# Patient Record
Sex: Male | Born: 1935 | Race: White | Hispanic: No | Marital: Married | Smoking: Never smoker
Health system: Southern US, Community
[De-identification: ages and names within clinical notes are randomized; demographics above are authoritative.]

## PROBLEM LIST (undated history)

## (undated) DIAGNOSIS — C801 Malignant (primary) neoplasm, unspecified: Secondary | ICD-10-CM

## (undated) DIAGNOSIS — F419 Anxiety disorder, unspecified: Secondary | ICD-10-CM

## (undated) DIAGNOSIS — K219 Gastro-esophageal reflux disease without esophagitis: Secondary | ICD-10-CM

## (undated) HISTORY — PX: PROSTATECTOMY: SHX69

## (undated) HISTORY — PX: OTHER SURGICAL HISTORY: SHX169

---

## 2017-09-28 ENCOUNTER — Other Ambulatory Visit: Payer: Self-pay | Admitting: Urology

## 2017-09-28 DIAGNOSIS — D49511 Neoplasm of unspecified behavior of right kidney: Secondary | ICD-10-CM

## 2017-10-06 ENCOUNTER — Ambulatory Visit (HOSPITAL_COMMUNITY): Payer: Self-pay

## 2017-10-10 ENCOUNTER — Ambulatory Visit (HOSPITAL_COMMUNITY)
Admission: RE | Admit: 2017-10-10 | Discharge: 2017-10-10 | Disposition: A | Discharge status: Home / Self Care | Payer: Medicare Other | Source: Ambulatory Visit | Attending: Urology | Admitting: Urology

## 2017-10-10 DIAGNOSIS — I700 Atherosclerosis of aorta: Secondary | ICD-10-CM | POA: Diagnosis not present

## 2017-10-10 DIAGNOSIS — D49511 Neoplasm of unspecified behavior of right kidney: Secondary | ICD-10-CM | POA: Diagnosis present

## 2017-10-10 DIAGNOSIS — K8020 Calculus of gallbladder without cholecystitis without obstruction: Secondary | ICD-10-CM | POA: Diagnosis not present

## 2017-10-10 DIAGNOSIS — K862 Cyst of pancreas: Secondary | ICD-10-CM | POA: Diagnosis not present

## 2017-10-10 LAB — POCT I-STAT CREATININE: Creatinine, Ser: 0.9 mg/dL (ref 0.61–1.24)

## 2017-10-10 MED ORDER — GADOBENATE DIMEGLUMINE 529 MG/ML IV SOLN
15.0000 mL | Freq: Once | INTRAVENOUS | Status: AC | PRN
  Administered 2017-10-10: 15 mL via INTRAVENOUS

## 2017-11-17 ENCOUNTER — Other Ambulatory Visit: Payer: Self-pay | Admitting: Urology

## 2017-12-19 NOTE — Patient Instructions (Addendum)
Cesar Dawson  12/19/2017   Your procedure is scheduled on: 01-06-18  Report to [facility] Main  Entrance  Report to admitting at        0530 AM    Call this number if you have problems the morning of surgery [phone]   Remember: Do not eat food or drink liquids :After Midnight.     Take these medicines the morning of surgery with A SIP OF WATER: omeprazole, atorvastatin                                You may not have any metal on your body including hair pins and              piercings  Do not wear jewelry,  lotions, powders or perfumes, deodorant                       Men may shave face and neck.   Do not bring valuables to the hospital. [place].  Contacts, dentures or bridgework may not be worn into surgery.  Leave suitcase in the car. After surgery it may be brought to your room.                Please read over the following fact sheets you were given: _____________________________________________________________________           [facility] - Preparing for Surgery Before surgery, you can play an important role.  Because skin is not sterile, your skin needs to be as free of germs as possible.  You can reduce the number of germs on your skin by washing with CHG (chlorahexidine gluconate) soap before surgery.  CHG is an antiseptic cleaner which kills germs and bonds with the skin to continue killing germs even after washing. Please DO NOT use if you have an allergy to CHG or antibacterial soaps.  If your skin becomes reddened/irritated stop using the CHG and inform your nurse when you arrive at Short Stay. Do not shave (including legs and underarms) for at least 48 hours prior to the first CHG shower.  You may shave your face/neck. Please follow these instructions carefully:  1.  Shower with CHG Soap the night before surgery and the  morning of Surgery.  2.  If you choose to wash your hair, wash your hair  first as usual with your  normal  shampoo.  3.  After you shampoo, rinse your hair and body thoroughly to remove the  shampoo.                           4.  Use CHG as you would any other liquid soap.  You can apply chg directly  to the skin and wash                       Gently with a scrungie or clean washcloth.  5.  Apply the CHG Soap to your body ONLY FROM THE NECK DOWN.   Do not use on face/ open  Wound or open sores. Avoid contact with eyes, ears mouth and genitals (private parts).                       Wash face,  Genitals (private parts) with your normal soap.             6.  Wash thoroughly, paying special attention to the area where your surgery  will be performed.  7.  Thoroughly rinse your body with warm water from the neck down.  8.  DO NOT shower/wash with your normal soap after using and rinsing off  the CHG Soap.                9.  Pat yourself dry with a clean towel.            10.  Wear clean pajamas.            11.  Place clean sheets on your bed the night of your first shower and do not  sleep with pets. Day of Surgery : Do not apply any lotions/deodorants the morning of surgery.  Please wear clean clothes to the hospital/surgery center.  FAILURE TO FOLLOW THESE INSTRUCTIONS MAY RESULT IN THE CANCELLATION OF YOUR SURGERY PATIENT SIGNATURE_________________________________  NURSE SIGNATURE__________________________________  ________________________________________________________________________  WHAT IS A BLOOD TRANSFUSION? Blood Transfusion Information  A transfusion is the replacement of blood or some of its parts. Blood is made up of multiple cells which provide different functions.  Red blood cells carry oxygen and are used for blood loss replacement.  White blood cells fight against infection.  Platelets control bleeding.  Plasma helps clot blood.  Other blood products are available for specialized needs, such as hemophilia or other  clotting disorders. BEFORE THE TRANSFUSION  Who gives blood for transfusions?   Healthy volunteers who are fully evaluated to make sure their blood is safe. This is blood bank blood. Transfusion therapy is the safest it has ever been in the practice of medicine. Before blood is taken from a donor, a complete history is taken to make sure that person has no history of diseases nor engages in risky social behavior (examples are intravenous drug use or sexual activity with multiple partners). The donor's travel history is screened to minimize risk of transmitting infections, such as malaria. The donated blood is tested for signs of infectious diseases, such as HIV and hepatitis. The blood is then tested to be sure it is compatible with you in order to minimize the chance of a transfusion reaction. If you or a relative donates blood, this is often done in anticipation of surgery and is not appropriate for emergency situations. It takes many days to process the donated blood. RISKS AND COMPLICATIONS Although transfusion therapy is very safe and saves many lives, the main dangers of transfusion include:   Getting an infectious disease.  Developing a transfusion reaction. This is an allergic reaction to something in the blood you were given. Every precaution is taken to prevent this. The decision to have a blood transfusion has been considered carefully by your caregiver before blood is given. Blood is not given unless the benefits outweigh the risks. AFTER THE TRANSFUSION  Right after receiving a blood transfusion, you will usually feel much better and more energetic. This is especially true if your red blood cells have gotten low (anemic). The transfusion raises the level of the red blood cells which carry oxygen, and this usually causes an energy increase.  The  nurse administering the transfusion will monitor you carefully for complications. HOME CARE INSTRUCTIONS  No special instructions are needed  after a transfusion. You may find your energy is better. Speak with your caregiver about any limitations on activity for underlying diseases you may have. SEEK MEDICAL CARE IF:   Your condition is not improving after your transfusion.  You develop redness or irritation at the intravenous (IV) site. SEEK IMMEDIATE MEDICAL CARE IF:  Any of the following symptoms occur over the next 12 hours:  Shaking chills.  You have a temperature by mouth above 102 F (38.9 C), not controlled by medicine.  Chest, back, or muscle pain.  People around you feel you are not acting correctly or are confused.  Shortness of breath or difficulty breathing.  Dizziness and fainting.  You get a rash or develop hives.  You have a decrease in urine output.  Your urine turns a dark color or changes to pink, red, or brown. Any of the following symptoms occur over the next 10 days:  You have a temperature by mouth above 102 F (38.9 C), not controlled by medicine.  Shortness of breath.  Weakness after normal activity.  The white part of the eye turns yellow (jaundice).  You have a decrease in the amount of urine or are urinating less often.  Your urine turns a dark color or changes to pink, red, or brown. Document Released: 05/21/2000 Document Revised: 08/16/2011 Document Reviewed: 01/08/2008 [facility] Patient Information 2014 [place], [place].  _______________________________________________________________________

## 2017-12-20 ENCOUNTER — Encounter (HOSPITAL_COMMUNITY): Payer: Self-pay

## 2017-12-20 ENCOUNTER — Other Ambulatory Visit: Payer: Self-pay

## 2017-12-20 ENCOUNTER — Encounter (HOSPITAL_COMMUNITY)
Admission: RE | Admit: 2017-12-20 | Discharge: 2017-12-20 | Disposition: A | Discharge status: Home / Self Care | Payer: Medicare Other | Source: Ambulatory Visit | Attending: Urology | Admitting: Urology

## 2017-12-20 DIAGNOSIS — Z01812 Encounter for preprocedural laboratory examination: Secondary | ICD-10-CM | POA: Diagnosis not present

## 2017-12-20 HISTORY — DX: Gastro-esophageal reflux disease without esophagitis: K21.9

## 2017-12-20 HISTORY — DX: Malignant (primary) neoplasm, unspecified: C80.1

## 2017-12-20 HISTORY — DX: Anxiety disorder, unspecified: F41.9

## 2017-12-20 LAB — COMPREHENSIVE METABOLIC PANEL
ALT: 26 U/L (ref 0–44)
AST: 23 U/L (ref 15–41)
Albumin: 4.4 g/dL (ref 3.5–5.0)
Alkaline Phosphatase: 48 U/L (ref 38–126)
Anion gap: 8 (ref 5–15)
BUN: 17 mg/dL (ref 8–23)
CHLORIDE: 106 mmol/L (ref 98–111)
CO2: 27 mmol/L (ref 22–32)
CREATININE: 0.9 mg/dL (ref 0.61–1.24)
Calcium: 9.5 mg/dL (ref 8.9–10.3)
GFR calc Af Amer: >60 mL/min (ref >60)
GFR calc non Af Amer: >60 mL/min (ref >60)
GLUCOSE: 100 mg/dL — AB (ref 70–99)
POTASSIUM: 4.5 mmol/L (ref 3.5–5.1)
Sodium: 141 mmol/L (ref 135–145)
Total Bilirubin: 1.4 mg/dL — ABNORMAL HIGH (ref 0.3–1.2)
Total Protein: 7 g/dL (ref 6.5–8.1)

## 2017-12-20 LAB — CBC
HCT: 44.1 % (ref 39.0–52.0)
Hemoglobin: 14.7 g/dL (ref 13.0–17.0)
MCH: 30.7 pg (ref 26.0–34.0)
MCHC: 33.3 g/dL (ref 30.0–36.0)
MCV: 92.1 fL (ref 78.0–100.0)
Platelets: 183 K/uL (ref 150–400)
RBC: 4.79 MIL/uL (ref 4.22–5.81)
RDW: 13.6 % (ref 11.5–15.5)
WBC: 6.1 K/uL (ref 4.0–10.5)

## 2017-12-27 ENCOUNTER — Other Ambulatory Visit (HOSPITAL_COMMUNITY): Payer: Medicare Other

## 2018-01-06 ENCOUNTER — Encounter (HOSPITAL_COMMUNITY): Payer: Self-pay | Admitting: Emergency Medicine

## 2018-01-06 ENCOUNTER — Observation Stay (HOSPITAL_COMMUNITY)
Admission: RE | Admit: 2018-01-06 | Discharge: 2018-01-08 | Disposition: A | Discharge status: Home / Self Care | Payer: Medicare Other | Source: Ambulatory Visit | Attending: Urology | Admitting: Urology

## 2018-01-06 ENCOUNTER — Other Ambulatory Visit: Payer: Self-pay

## 2018-01-06 ENCOUNTER — Encounter (HOSPITAL_COMMUNITY)
Admission: RE | Disposition: A | Discharge status: Home / Self Care | Payer: Self-pay | Source: Ambulatory Visit | Attending: Urology

## 2018-01-06 ENCOUNTER — Inpatient Hospital Stay (HOSPITAL_COMMUNITY): Payer: Medicare Other | Admitting: Certified Registered Nurse Anesthetist

## 2018-01-06 DIAGNOSIS — K219 Gastro-esophageal reflux disease without esophagitis: Secondary | ICD-10-CM | POA: Diagnosis not present

## 2018-01-06 DIAGNOSIS — E7800 Pure hypercholesterolemia, unspecified: Secondary | ICD-10-CM | POA: Diagnosis not present

## 2018-01-06 DIAGNOSIS — C641 Malignant neoplasm of right kidney, except renal pelvis: Secondary | ICD-10-CM | POA: Diagnosis not present

## 2018-01-06 DIAGNOSIS — N2889 Other specified disorders of kidney and ureter: Secondary | ICD-10-CM | POA: Diagnosis present

## 2018-01-06 HISTORY — PX: ROBOTIC ASSITED PARTIAL NEPHRECTOMY: SHX6087

## 2018-01-06 LAB — TYPE AND SCREEN
ABO/RH(D): A NEG
Antibody Screen: NEGATIVE

## 2018-01-06 LAB — HEMOGLOBIN AND HEMATOCRIT, BLOOD
HEMATOCRIT: 42.4 % (ref 39.0–52.0)
HEMOGLOBIN: 14 g/dL (ref 13.0–17.0)

## 2018-01-06 LAB — ABO/RH: ABO/RH(D): A NEG

## 2018-01-06 SURGERY — NEPHRECTOMY, PARTIAL, ROBOT-ASSISTED
Anesthesia: General | Laterality: Right

## 2018-01-06 MED ORDER — MEPERIDINE HCL 50 MG/ML IJ SOLN: 6.2500 mg | INTRAMUSCULAR | Status: DC | PRN

## 2018-01-06 MED ORDER — LIDOCAINE 2% (20 MG/ML) 5 ML SYRINGE
INTRAMUSCULAR | Status: DC | PRN
  Administered 2018-01-06: 80 mg via INTRAVENOUS

## 2018-01-06 MED ORDER — ONDANSETRON HCL 4 MG/2ML IJ SOLN
INTRAMUSCULAR | Status: DC | PRN
  Administered 2018-01-06: 4 mg via INTRAVENOUS

## 2018-01-06 MED ORDER — DEXAMETHASONE SODIUM PHOSPHATE 10 MG/ML IJ SOLN
INTRAMUSCULAR | Status: AC
  Filled 2018-01-06: qty 1

## 2018-01-06 MED ORDER — HYDROMORPHONE HCL 1 MG/ML IJ SOLN: 0.5000 mg | INTRAMUSCULAR | Status: DC | PRN

## 2018-01-06 MED ORDER — PROMETHAZINE HCL 25 MG/ML IJ SOLN: 6.2500 mg | INTRAMUSCULAR | Status: DC | PRN

## 2018-01-06 MED ORDER — SUGAMMADEX SODIUM 200 MG/2ML IV SOLN
INTRAVENOUS | Status: DC | PRN
  Administered 2018-01-06: 300 mg via INTRAVENOUS

## 2018-01-06 MED ORDER — BUPIVACAINE LIPOSOME 1.3 % IJ SUSP
20.0000 mL | Freq: Once | INTRAMUSCULAR | Status: AC
Start: 2018-01-06 — End: 2018-01-06
  Administered 2018-01-06: 20 mL
  Filled 2018-01-06: qty 20

## 2018-01-06 MED ORDER — ROCURONIUM BROMIDE 10 MG/ML (PF) SYRINGE
PREFILLED_SYRINGE | INTRAVENOUS | Status: AC
  Filled 2018-01-06: qty 10

## 2018-01-06 MED ORDER — PHENYLEPHRINE HCL 10 MG/ML IJ SOLN
INTRAMUSCULAR | Status: AC
  Filled 2018-01-06: qty 2

## 2018-01-06 MED ORDER — SODIUM CHLORIDE 0.9 % IJ SOLN
INTRAMUSCULAR | Status: DC | PRN
  Administered 2018-01-06: 13 mL

## 2018-01-06 MED ORDER — HEMOSTATIC AGENTS (NO CHARGE) OPTIME
TOPICAL | Status: DC | PRN
Start: 2018-01-06 — End: 2018-01-06
  Administered 2018-01-06: 2 via TOPICAL

## 2018-01-06 MED ORDER — ROCURONIUM BROMIDE 10 MG/ML (PF) SYRINGE
PREFILLED_SYRINGE | INTRAVENOUS | Status: DC | PRN
  Administered 2018-01-06: 30 mg via INTRAVENOUS
  Administered 2018-01-06: 50 mg via INTRAVENOUS
  Administered 2018-01-06: 20 mg via INTRAVENOUS
  Administered 2018-01-06: 10 mg via INTRAVENOUS

## 2018-01-06 MED ORDER — PANTOPRAZOLE SODIUM 40 MG PO TBEC
40.0000 mg | DELAYED_RELEASE_TABLET | Freq: Every day | ORAL | Status: DC
  Administered 2018-01-06 – 2018-01-08 (×3): 40 mg via ORAL
  Filled 2018-01-06 (×3): qty 1

## 2018-01-06 MED ORDER — BUPIVACAINE-EPINEPHRINE 0.5% -1:200000 IJ SOLN
INTRAMUSCULAR | Status: DC | PRN
  Administered 2018-01-06: 30 mL

## 2018-01-06 MED ORDER — SUGAMMADEX SODIUM 200 MG/2ML IV SOLN
INTRAVENOUS | Status: AC
  Filled 2018-01-06: qty 4

## 2018-01-06 MED ORDER — ATORVASTATIN CALCIUM 20 MG PO TABS
20.0000 mg | ORAL_TABLET | Freq: Every day | ORAL | Status: DC
  Administered 2018-01-06 – 2018-01-08 (×3): 20 mg via ORAL
  Filled 2018-01-06 (×3): qty 1

## 2018-01-06 MED ORDER — OXYCODONE-ACETAMINOPHEN 5-325 MG PO TABS: 1.0000 | ORAL_TABLET | ORAL | 0 refills | Status: AC | PRN

## 2018-01-06 MED ORDER — LACTATED RINGERS IV SOLN
INTRAVENOUS | Status: DC
  Administered 2018-01-06 (×2): via INTRAVENOUS
  Administered 2018-01-06: 1000 mL via INTRAVENOUS

## 2018-01-06 MED ORDER — BUPIVACAINE-EPINEPHRINE (PF) 0.5% -1:200000 IJ SOLN
INTRAMUSCULAR | Status: AC
  Filled 2018-01-06: qty 30

## 2018-01-06 MED ORDER — LIDOCAINE 2% (20 MG/ML) 5 ML SYRINGE
INTRAMUSCULAR | Status: AC
  Filled 2018-01-06: qty 5

## 2018-01-06 MED ORDER — HYDROMORPHONE HCL 1 MG/ML IJ SOLN: 0.2500 mg | INTRAMUSCULAR | Status: DC | PRN

## 2018-01-06 MED ORDER — FENTANYL CITRATE (PF) 250 MCG/5ML IJ SOLN
INTRAMUSCULAR | Status: AC
  Filled 2018-01-06: qty 5

## 2018-01-06 MED ORDER — ONDANSETRON HCL 4 MG/2ML IJ SOLN: 4.0000 mg | INTRAMUSCULAR | Status: DC | PRN

## 2018-01-06 MED ORDER — DEXAMETHASONE SODIUM PHOSPHATE 10 MG/ML IJ SOLN
INTRAMUSCULAR | Status: DC | PRN
  Administered 2018-01-06: 10 mg via INTRAVENOUS

## 2018-01-06 MED ORDER — ESCITALOPRAM OXALATE 10 MG PO TABS
10.0000 mg | ORAL_TABLET | Freq: Every day | ORAL | Status: DC
  Administered 2018-01-06 – 2018-01-07 (×2): 10 mg via ORAL
  Filled 2018-01-06 (×2): qty 1

## 2018-01-06 MED ORDER — PHENYLEPHRINE 40 MCG/ML (10ML) SYRINGE FOR IV PUSH (FOR BLOOD PRESSURE SUPPORT)
PREFILLED_SYRINGE | INTRAVENOUS | Status: AC
  Filled 2018-01-06: qty 10

## 2018-01-06 MED ORDER — FENTANYL CITRATE (PF) 250 MCG/5ML IJ SOLN
INTRAMUSCULAR | Status: DC | PRN
  Administered 2018-01-06: 100 ug via INTRAVENOUS
  Administered 2018-01-06 (×3): 50 ug via INTRAVENOUS

## 2018-01-06 MED ORDER — SENNOSIDES-DOCUSATE SODIUM 8.6-50 MG PO TABS
2.0000 | ORAL_TABLET | Freq: Every day | ORAL | Status: DC
  Administered 2018-01-06 – 2018-01-07 (×2): 2 via ORAL
  Filled 2018-01-06 (×2): qty 2

## 2018-01-06 MED ORDER — ONDANSETRON HCL 4 MG/2ML IJ SOLN
INTRAMUSCULAR | Status: AC
  Filled 2018-01-06: qty 2

## 2018-01-06 MED ORDER — LACTATED RINGERS IV SOLN: INTRAVENOUS | Status: DC

## 2018-01-06 MED ORDER — ACETAMINOPHEN 500 MG PO TABS
1000.0000 mg | ORAL_TABLET | Freq: Four times a day (QID) | ORAL | Status: AC
  Administered 2018-01-06 – 2018-01-07 (×4): 1000 mg via ORAL
  Filled 2018-01-06 (×4): qty 2

## 2018-01-06 MED ORDER — CEFAZOLIN SODIUM-DEXTROSE 1-4 GM/50ML-% IV SOLN
1.0000 g | Freq: Three times a day (TID) | INTRAVENOUS | Status: AC
  Administered 2018-01-06 (×2): 1 g via INTRAVENOUS
  Filled 2018-01-06 (×2): qty 50

## 2018-01-06 MED ORDER — CEFAZOLIN SODIUM-DEXTROSE 2-4 GM/100ML-% IV SOLN
2.0000 g | INTRAVENOUS | Status: AC
  Administered 2018-01-06: 2 g via INTRAVENOUS
  Filled 2018-01-06: qty 100

## 2018-01-06 MED ORDER — SODIUM CHLORIDE 0.9 % IV SOLN
INTRAVENOUS | Status: DC | PRN
  Administered 2018-01-06: 50 ug/min via INTRAVENOUS

## 2018-01-06 MED ORDER — SODIUM CHLORIDE 0.9 % IJ SOLN
INTRAMUSCULAR | Status: AC
  Filled 2018-01-06: qty 50

## 2018-01-06 MED ORDER — OXYCODONE HCL 5 MG PO TABS
5.0000 mg | ORAL_TABLET | ORAL | Status: DC | PRN
  Administered 2018-01-06 – 2018-01-08 (×3): 5 mg via ORAL
  Filled 2018-01-06 (×3): qty 1

## 2018-01-06 MED ORDER — PROPOFOL 10 MG/ML IV BOLUS
INTRAVENOUS | Status: AC
  Filled 2018-01-06: qty 40

## 2018-01-06 MED ORDER — DOCUSATE SODIUM 100 MG PO CAPS
100.0000 mg | ORAL_CAPSULE | Freq: Two times a day (BID) | ORAL | Status: DC
  Administered 2018-01-06 – 2018-01-08 (×5): 100 mg via ORAL
  Filled 2018-01-06 (×5): qty 1

## 2018-01-06 MED ORDER — DEXTROSE-NACL 5-0.45 % IV SOLN
INTRAVENOUS | Status: DC
  Administered 2018-01-06: 13:00:00 via INTRAVENOUS

## 2018-01-06 MED ORDER — PROPOFOL 10 MG/ML IV BOLUS
INTRAVENOUS | Status: DC | PRN
  Administered 2018-01-06: 150 mg via INTRAVENOUS

## 2018-01-06 SURGICAL SUPPLY — 74 items
APPLICATOR ARISTA FLEXITIP XL (MISCELLANEOUS) IMPLANT
APPLICATOR SURGIFLO ENDO (HEMOSTASIS) ×3 IMPLANT
CHLORAPREP W/TINT 26ML (MISCELLANEOUS) ×3 IMPLANT
CLIP VESOLOCK LG 6/CT PURPLE (CLIP) ×3 IMPLANT
CLIP VESOLOCK MED LG 6/CT (CLIP) ×6 IMPLANT
CLIP VESOLOCK XL 6/CT (CLIP) IMPLANT
COVER SURGICAL LIGHT HANDLE (MISCELLANEOUS) ×3 IMPLANT
COVER TIP SHEARS 8 DVNC (MISCELLANEOUS) ×1 IMPLANT
COVER TIP SHEARS 8MM DA VINCI (MISCELLANEOUS) ×2
DECANTER SPIKE VIAL GLASS SM (MISCELLANEOUS) ×3 IMPLANT
DERMABOND ADVANCED (GAUZE/BANDAGES/DRESSINGS) ×2
DERMABOND ADVANCED .7 DNX12 (GAUZE/BANDAGES/DRESSINGS) ×1 IMPLANT
DRAIN CHANNEL 15F RND FF 3/16 (WOUND CARE) ×3 IMPLANT
DRAPE ARM DVNC X/XI (DISPOSABLE) ×4 IMPLANT
DRAPE COLUMN DVNC XI (DISPOSABLE) ×1 IMPLANT
DRAPE DA VINCI XI ARM (DISPOSABLE) ×8
DRAPE DA VINCI XI COLUMN (DISPOSABLE) ×2
DRAPE INCISE IOBAN 66X45 STRL (DRAPES) ×3 IMPLANT
DRAPE LAPAROSCOPIC ABDOMINAL (DRAPES) ×3 IMPLANT
DRAPE SHEET LG 3/4 BI-LAMINATE (DRAPES) ×3 IMPLANT
DRSG TEGADERM 4X4.75 (GAUZE/BANDAGES/DRESSINGS) ×3 IMPLANT
ELECT PENCIL ROCKER SW 15FT (MISCELLANEOUS) ×3 IMPLANT
ELECT REM PT RETURN 15FT ADLT (MISCELLANEOUS) ×3 IMPLANT
EVACUATOR SILICONE 100CC (DRAIN) ×3 IMPLANT
FLOSEAL 5ML (HEMOSTASIS) ×6 IMPLANT
GAUZE SPONGE 2X2 8PLY STRL LF (GAUZE/BANDAGES/DRESSINGS) ×1 IMPLANT
GLOVE BIO SURGEON STRL SZ 6.5 (GLOVE) IMPLANT
GLOVE BIO SURGEONS STRL SZ 6.5 (GLOVE)
GLOVE BIOGEL M STRL SZ7.5 (GLOVE) ×6 IMPLANT
GLOVE BIOGEL PI IND STRL 8 (GLOVE) ×3 IMPLANT
GLOVE BIOGEL PI INDICATOR 8 (GLOVE) ×6
GLOVE ECLIPSE 7.5 STRL STRAW (GLOVE) ×9 IMPLANT
GOWN STRL REUS W/TWL LRG LVL3 (GOWN DISPOSABLE) ×15 IMPLANT
HEMOSTAT ARISTA ABSORB 3G PWDR (MISCELLANEOUS) IMPLANT
HEMOSTAT SURGICEL 4X8 (HEMOSTASIS) ×3 IMPLANT
HOLDER FOLEY CATH W/STRAP (MISCELLANEOUS) ×3 IMPLANT
IRRIG SUCT STRYKERFLOW 2 WTIP (MISCELLANEOUS) ×3
IRRIGATION SUCT STRKRFLW 2 WTP (MISCELLANEOUS) ×1 IMPLANT
IV LACTATED RINGERS 1000ML (IV SOLUTION) ×3 IMPLANT
KIT BASIN OR (CUSTOM PROCEDURE TRAY) ×3 IMPLANT
LOOP VESSEL MAXI BLUE (MISCELLANEOUS) ×3 IMPLANT
MARKER SKIN DUAL TIP RULER LAB (MISCELLANEOUS) ×3 IMPLANT
NEEDLE INSUFFLATION 14GA 120MM (NEEDLE) ×3 IMPLANT
NS IRRIG 1000ML POUR BTL (IV SOLUTION) IMPLANT
PAD POSITIONING PINK XL (MISCELLANEOUS) ×3 IMPLANT
PORT ACCESS TROCAR AIRSEAL 12 (TROCAR) ×1 IMPLANT
PORT ACCESS TROCAR AIRSEAL 5M (TROCAR) ×2
POSITIONER SURGICAL ARM (MISCELLANEOUS) ×6 IMPLANT
POUCH SPECIMEN RETRIEVAL 10MM (ENDOMECHANICALS) ×3 IMPLANT
SEAL CANN UNIV 5-8 DVNC XI (MISCELLANEOUS) ×4 IMPLANT
SEAL XI 5MM-8MM UNIVERSAL (MISCELLANEOUS) ×8
SET TRI-LUMEN FLTR TB AIRSEAL (TUBING) ×3 IMPLANT
SOLUTION ELECTROLUBE (MISCELLANEOUS) ×3 IMPLANT
SPOGE SURGIFLO 8M (HEMOSTASIS)
SPONGE GAUZE 2X2 STER 10/PKG (GAUZE/BANDAGES/DRESSINGS) ×2
SPONGE SURGIFLO 8M (HEMOSTASIS) IMPLANT
SURGIFLO W/THROMBIN 8M KIT (HEMOSTASIS) IMPLANT
SUT ETHILON 3 0 PS 1 (SUTURE) ×3 IMPLANT
SUT MNCRL AB 4-0 PS2 18 (SUTURE) ×6 IMPLANT
SUT V-LOC BARB 180 2/0GR6 GS22 (SUTURE) ×9
SUT VIC AB 0 CT1 27 (SUTURE) ×2
SUT VIC AB 0 CT1 27XBRD ANTBC (SUTURE) ×1 IMPLANT
SUT VICRYL 0 UR6 27IN ABS (SUTURE) IMPLANT
SUT VLOC BARB 180 ABS3/0GR12 (SUTURE) ×3
SUTURE V-LC BRB 180 2/0GR6GS22 (SUTURE) ×3 IMPLANT
SUTURE VLOC BRB 180 ABS3/0GR12 (SUTURE) ×1 IMPLANT
SYR 10ML LL (SYRINGE) IMPLANT
TOWEL OR 17X26 10 PK STRL BLUE (TOWEL DISPOSABLE) ×3 IMPLANT
TOWEL OR NON WOVEN STRL DISP B (DISPOSABLE) ×3 IMPLANT
TRAY FOLEY MTR SLVR 16FR STAT (SET/KITS/TRAYS/PACK) ×3 IMPLANT
TRAY LAPAROSCOPIC (CUSTOM PROCEDURE TRAY) ×3 IMPLANT
TROCAR BLADELESS OPT 5 100 (ENDOMECHANICALS) ×3 IMPLANT
TROCAR XCEL 12X100 BLDLESS (ENDOMECHANICALS) IMPLANT
WATER STERILE IRR 1000ML POUR (IV SOLUTION) ×3 IMPLANT

## 2018-01-06 NOTE — H&P (Signed)
Renal Mass Surveillance  HPI: Cesar Dawson is a 82 year-old male established patient who is here ongoing eval and management of a renal mass.  The mass is on the right side.   The lesion(s) was first noted on 10/10/2017. The mass was seen on CT Scan.   The mass was last imaged 1 week ago. The area was described as 4.5 right anterior upper pole with mild enhancement. The mass is stable in appearance since last imaging study.   Patient was first told about area on kidney 15 yrs ago. h/o of CaP. The patient has no signs or symptoms of flank pain, hematuria, or any constitutional symptoms.     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   Tobacco Use Assessment Completed: Used Tobacco in last 30 days? Has never drank.  Drinks 3 caffeinated drinks per day. Has not had a blood transfusion.    REVIEW OF SYSTEMS:    GU Review Male:   Patient denies frequent urination, hard to postpone urination, burning/ pain with urination, get up at night to urinate, leakage of urine, stream starts and stops, trouble starting your stream, have to strain to urinate , erection problems, and penile pain.  Gastrointestinal (Upper):   Patient denies nausea, vomiting, and indigestion/ heartburn.  Gastrointestinal (Lower):   Patient denies diarrhea and constipation.  Constitutional:   Patient denies fever, night sweats, weight loss, and fatigue.  Skin:   Patient denies skin rash/ lesion and itching.  Eyes:   Patient denies blurred vision and double vision.  Ears/ Nose/ Throat:   Patient denies sore  throat and sinus problems.  Hematologic/Lymphatic:   Patient denies swollen glands and easy bruising.  Cardiovascular:   Patient denies leg swelling and chest pains.  Respiratory:   Patient denies cough and shortness of breath.  Endocrine:   Patient denies excessive thirst.  Musculoskeletal:   Patient denies back pain and joint pain.  Neurological:   Patient denies headaches and dizziness.  Psychologic:   Patient denies depression and anxiety.   VITAL SIGNS:      10/25/2017 01:10 PM  Weight 170 lb / 77.11 kg  Height 70 in / 177.8 cm  BP 129/81 mmHg  Pulse 82 /min  BMI 24.4 kg/m   PAST DATA REVIEWED:  Source Of History:  Patient  Records Review:   Previous Doctor Records, Previous Patient Records, POC Tool  Urine Test Review:   Urinalysis  X-Ray Review: MRI Kidney: Reviewed Films.     PROCEDURES:          Urinalysis Dipstick Dipstick Cont'd  Color: Yellow Bilirubin: Neg  Appearance: Clear Ketones: Neg  Specific Gravity: 1.025 Blood: Neg  pH: <=5.0 Protein: Trace  Glucose: Neg Urobilinogen: 0.2    Nitrites: Neg    Leukocyte Esterase: Neg    ASSESSMENT:      ICD-10 Details  1 GU:   Benign Neo Kidney, Unspec - D30.00      PLAN:           Orders         Schedule  X-Rays: 6 Months - MRI Kidney With and Without I.V. Contrast          Document Letter(s):  Created for Patient: Clinical Summary         Notes:   The patient has a softly enhancing right renal mass, concerning for low-grade renal cell carcinoma. I spoke to the patient about the findings. We discussed treatment options. At this point it is big enough to suggest treatment. However, I don't think it would be amenable to needle ablation given its location in overall size. Thus, treatment for him would be a partial nephrectomy.   Alternative option would be to keep close eye on it given that it does appear to be low-grade. I did tell the patient that there is a slight risk of the him developing systemic  disease, but based on the appearance I think it would be reasonable for him to repeat an MRI in 6 months. At that time, we will again discuss the options of management. The patient is not eager to have surgery and would rather repeat the MRI in 6 months.

## 2018-01-06 NOTE — Interval H&P Note (Signed)
History and Physical Interval Note: After further discussion with the patient's family he has opted to proceed with right-sided partial nephrectomy.  We discussed the surgery and significant amount of detail including the risk and the benefits.  He has agreed to proceed.  There are no changes to his history and physical.  Vitals:   01/06/18 0551 01/06/18 0552 01/06/18 0614  BP:  (!) 154/82   Pulse:  66   Resp:  16   Temp: (P) 97.7 F (36.5 C) 97.7 F (36.5 C)   TempSrc: (P) Oral Oral   SpO2:  99%   Weight:   76.7 kg (169 lb)  Height:   5\' 9"  (1.753 m)  NAD RRR CTA-B 01/06/2018 6:46 AM  Adine Madura  has presented today for surgery, with the diagnosis of right renal mass  The various methods of treatment have been discussed with the patient and family. After consideration of risks, benefits and other options for treatment, the patient has consented to  Procedure(s): XI ROBOTIC ASSITED PARTIAL NEPHRECTOMY (Right) as a surgical intervention .  The patient's history has been reviewed, patient examined, no change in status, stable for surgery.  I have reviewed the patient's chart and labs.  Questions were answered to the patient's satisfaction.     Louis Meckel W

## 2018-01-06 NOTE — Anesthesia Procedure Notes (Signed)
Procedure Name: Intubation Date/Time: 01/06/2018 7:27 AM Performed by: Mitzie Na, CRNA Pre-anesthesia Checklist: Patient identified, Emergency Drugs available, Suction available, Timeout performed and Patient being monitored Patient Re-evaluated:Patient Re-evaluated prior to induction Oxygen Delivery Method: Circle system utilized Preoxygenation: Pre-oxygenation with 100% oxygen Induction Type: IV induction Ventilation: Mask ventilation without difficulty Laryngoscope Size: Mac and 3 Grade View: Grade I Tube type: Oral Tube size: 7.5 mm Number of attempts: 1 Airway Equipment and Method: Stylet Placement Confirmation: ETT inserted through vocal cords under direct vision and breath sounds checked- equal and bilateral Secured at: 22 cm Tube secured with: Tape Dental Injury: Teeth and Oropharynx as per pre-operative assessment

## 2018-01-06 NOTE — Transfer of Care (Signed)
Immediate Anesthesia Transfer of Care Note  Patient: Cesar Dawson  Procedure(s) Performed: XI ROBOTIC ASSITED PARTIAL NEPHRECTOMY (Right )  Patient Location: PACU  Anesthesia Type:General  Level of Consciousness: awake, alert , oriented and patient cooperative  Airway & Oxygen Therapy: Patient Spontanous Breathing and Patient connected to face mask oxygen  Post-op Assessment: Report given to RN, Post -op Vital signs reviewed and stable and Patient moving all extremities  Post vital signs: Reviewed and stable  Last Vitals:  Vitals Value Taken Time  BP 136/74 01/06/2018 11:13 AM  Temp    Pulse 81 01/06/2018 11:14 AM  Resp 14 01/06/2018 11:14 AM  SpO2 100 % 01/06/2018 11:14 AM  Vitals shown include unvalidated device data.  Last Pain:  Vitals:   01/06/18 0614  TempSrc:   PainSc: 0-No pain      Patients Stated Pain Goal: 4 (97/94/80 1655)  Complications: No apparent anesthesia complications

## 2018-01-06 NOTE — Discharge Summary (Signed)
[facility] Discharge Summary  Admit date: 01/06/2018  Discharge date and time: 01/08/2018  Discharge to: Home  Discharge Service: Urology  Discharge Attending Physician: Nicolette Bang, MD  Discharge  Diagnoses: Renal mass  Secondary Diagnosis: Active Problems:   Renal mass   OR Procedures: Procedure(s): XI ROBOTIC ASSITED PARTIAL NEPHRECTOMY 01/06/2018   Ancillary Procedures: None   Discharge Day Services: The patient was seen and examined by the Urology team both in the morning and immediately prior to discharge.  Vital signs and laboratory values were stable and within normal limits.  The physical exam was benign and unchanged and all surgical wounds were examined.  Discharge instructions were explained and all questions answered.  Subjective  No acute events overnight. Pain Controlled. No fever or chills.  Objective Patient Vitals for the past 8 hrs:  BP Temp Temp src Pulse Resp SpO2 Height Weight  01/06/18 1115 131/73 - - 82 19 100 % - -  01/06/18 1113 136/74 97.9 F (36.6 C) - 90 13 99 % - -  01/06/18 1610 - - - - - - 5\' 9"  (1.753 m) 76.7 kg (169 lb)  01/06/18 0552 (!) 154/82 97.7 F (36.5 C) Oral 66 16 99 % - -  01/06/18 0551 - 97.7 F (36.5 C) (P) Oral - - - - -   Total I/O In: 1900 [I.V.:1800; IV Piggyback:100] Out: 550 [Urine:300; Blood:250]  General Appearance:        No acute distress Lungs:                      Normal work of breathing on room air Heart:                                Regular rate and rhythm Abdomen:                          Soft, non-tender, non-distended, incisions c/d/i Extremities:                       Warm and well perfused   Hospital Course:  The patient underwent robotic right partial nephrectomy on 01/06/2018.  The patient tolerated the procedure well, was extubated in the OR, and afterwards was taken to the PACU for routine post-surgical care. When stable the patient was transferred to the floor.  The patient did well  postoperatively.  The patient's diet was slowly advanced and at the time of discharge was tolerating a regular diet.  The patient was discharged home on POD#2, at which point was tolerating a regular solid diet, was able to void spontaneously, have adequate pain control with P.O. pain medication, and could ambulate without difficulty. The patient will follow up with [place] for post op check.   Condition at Discharge: Improved  Discharge Medications:  Allergies as of 01/06/2018   No Known Allergies     Medication List    TAKE these medications   atorvastatin 20 MG tablet Commonly known as:  LIPITOR Take 20 mg by mouth daily.   cholecalciferol 1000 units tablet Commonly known as:  VITAMIN D Take 1,000 Units by mouth every other day.   escitalopram 10 MG tablet Commonly known as:  LEXAPRO Take 10 mg by mouth at bedtime.   Fish Oil 1000 MG Caps Take 1 capsule by mouth daily.   omeprazole 40 MG capsule Commonly known as:  PRILOSEC Take 40 mg by mouth daily.   oxyCODONE-acetaminophen 5-325 MG tablet Commonly known as:  PERCOCET Take 1 tablet by mouth every 4 (four) hours as needed for severe pain.

## 2018-01-06 NOTE — Op Note (Signed)
Preoperative diagnosis:  1. right renal mass   Postoperative diagnosis:  1. same   Procedure: 1. Robotic assisted laparoscopic right partial nephrectomy  Surgeon: Ardis Hughs, MD 1st assistant: Dr. Lewie Loron, MD  Anesthesia: General  Complications: None  Intraoperative findings:  #1. Large exophytic upper pole anterior mass  #2. Warm ischemia time 13 min  EBL: 200cc  Specimens: right renal mass   Indication:  Cesar Dawson is a 82 y.o. patient with right renal mass.  After reviewing the management options for treatment, he elected to proceed with the above surgical procedure(s). We have discussed the potential benefits and risks of the procedure, side effects of the proposed treatment, the likelihood of the patient achieving the goals of the procedure, and any potential problems that might occur during the procedure or recuperation. Informed consent has been obtained.   Description of procedure:  An assistant was required for this surgical procedure.  The duties of the assistant included but were not limited to suctioning, passing suture, camera manipulation, retraction. This procedure would not be able to be performed without an Environmental consultant.  The patient was taken to the operating room and a general anesthetic was administered. The patient was given preoperative antibiotics, placed in the right modified flank position with care to pad all potential pressure points, and prepped and draped in the usual sterile fashion. Next a preoperative timeout was performed.  A site was selected on the right side of the umbilicus for placement of the camera port. This was placed using a standard modified Hassan technique with entry into the peritoneum with a 10 mm 0 deg laparoscope with a visual obturator. We entered the peritoneum without incident and established pneumoperitoneum. The camera was then used to inspect the abdomen and there was no evidence of any intra-abdominal injuries or  other abnormalities. The remaining abdominal ports were then placed. 8 mm robotic ports were placed in the right upper quadrant, right lower quadrant, and far right lateral abdominal wall. A 12 mm port was placed in the upper midline for laparoscopic assistance.  All ports were placed under direct vision without difficulty. The surgical cart was then docked.   Utilizing the cautery scissors, the white line of Toldt was incised allowing the colon to be mobilized medially and the plane between the mesocolon and the anterior layer of Gerota's fascia to be developed and the kidney to be exposed. The ureter and gonadal vein were identified inferiorly and the ureter was lifted anteriorly off the psoas muscle. Dissection proceeded superiorly along the gonadal vein until the renal vein was identified. The renal hilum was then carefully isolated with a combination of blunt and sharp dissection allowing the renal arterial and venous structures to be separated and isolated in preparation for renal hilar vessel clamping.  Attention turned to the kidney and the perinephric fat surrounding the renal mass was removed and the kidney was mobilized sufficiently for exposure and resection of the renal mass.  Once the renal mass was properly isolated, preparations were made for resection of the tumor. The renal artery was then clamped with a bulldog clamp. The tumor was then excised with cold scissor dissection along with an adequate visible gross margin of normal renal parenchyma. The tumor appeared to be excised without any gross violation of the tumor. The renal collecting system was not entered during removal of the tumor. A running 3-0 V-lock suture was then brought through the capsule of the kidney and run along the base of the renal  defect to provide hemostasis and close any entry into the renal collecting system if present. Weck clips were used to secure this suture outside the renal capsule at the proximal and  distal ends. The bulldog clamps were then removed from the renal hilar vessel. A running 2-0 V lock suture was then used to close the capsule of the kidney using a sliding clip technique which resulted in excellent hemostasis. An additional hemostatic agent (Surgiflo) was then placed into the renal defect.Surgicel was then placed over the defect.   Total warm renal ischemia time was  13 minutes. The renal tumor resection site was examined. Hemostasis appeared adequate.   The kidney was placed back into its normal anatomic position and covered with perinephric fat as needed. A # 17 Blake drain was then brought through the lateral lower port site and positioned in the perinephric space. It was secured to the skin with a nylon suture. The surgical robotic cart was undocked. The renal tumor specimen was removed intact within an endopouch retrieval bag via the camera port sites. The camera port site and the other 12 mm port site were then closed at the fascial level with 0-vicryl suture. All other laparoscopic/robotic ports were removed under direct vision and the pneumoperitoneum let down with inspection of the operative field performed and hemostasis again confirmed. All incision sites were then injected with local anesthetic and reapproximated at the skin level with 4-0 monocryl subcuticular closures. Dermabond was applied to the skin. The patient tolerated the procedure well and without complications. The patient was able to be extubated and transferred to the recovery unit in satisfactory condition.  Ardis Hughs, M.D.

## 2018-01-06 NOTE — Anesthesia Preprocedure Evaluation (Signed)
Anesthesia Evaluation  Patient identified by MRN, date of birth, ID band Patient awake    Reviewed: Allergy & Precautions, NPO status , Patient's Chart, lab work & pertinent test results  Airway Mallampati: I  TM Distance: >3 FB Neck ROM: Full    Dental  (+) Teeth Intact, Dental Advisory Given   Pulmonary neg pulmonary ROS,    breath sounds clear to auscultation       Cardiovascular negative cardio ROS   Rhythm:Regular Rate:Normal     Neuro/Psych Anxiety negative neurological ROS     GI/Hepatic Neg liver ROS, GERD  Medicated,  Endo/Other  negative endocrine ROS  Renal/GU negative Renal ROS     Musculoskeletal negative musculoskeletal ROS (+)   Abdominal Normal abdominal exam  (+)   Peds negative pediatric ROS (+)  Hematology   Anesthesia Other Findings   Reproductive/Obstetrics                             Anesthesia Physical Anesthesia Plan  ASA: II  Anesthesia Plan: General   Post-op Pain Management:    Induction: Intravenous  PONV Risk Score and Plan: 3 and Ondansetron, Dexamethasone and Midazolam  Airway Management Planned: Oral ETT  Additional Equipment: None  Intra-op Plan:   Post-operative Plan: Extubation in OR  Informed Consent: I have reviewed the patients History and Physical, chart, labs and discussed the procedure including the risks, benefits and alternatives for the proposed anesthesia with the patient or authorized representative who has indicated his/her understanding and acceptance.   Dental advisory given  Plan Discussed with: CRNA  Anesthesia Plan Comments:         Anesthesia Quick Evaluation

## 2018-01-07 DIAGNOSIS — C641 Malignant neoplasm of right kidney, except renal pelvis: Secondary | ICD-10-CM | POA: Diagnosis not present

## 2018-01-07 LAB — BASIC METABOLIC PANEL
ANION GAP: 7 (ref 5–15)
BUN: 14 mg/dL (ref 8–23)
CHLORIDE: 104 mmol/L (ref 98–111)
CO2: 26 mmol/L (ref 22–32)
Calcium: 8.6 mg/dL — ABNORMAL LOW (ref 8.9–10.3)
Creatinine, Ser: 0.95 mg/dL (ref 0.61–1.24)
GFR calc non Af Amer: >60 mL/min (ref >60)
Glucose, Bld: 138 mg/dL — ABNORMAL HIGH (ref 70–99)
Potassium: 4.2 mmol/L (ref 3.5–5.1)
SODIUM: 137 mmol/L (ref 135–145)

## 2018-01-07 LAB — CREATININE, FLUID (PLEURAL, PERITONEAL, JP DRAINAGE): CREAT FL: 1 mg/dL

## 2018-01-07 LAB — CREATININE, SERUM
Creatinine, Ser: 1.03 mg/dL (ref 0.61–1.24)
GFR calc Af Amer: >60 mL/min (ref >60)
GFR calc non Af Amer: >60 mL/min (ref >60)

## 2018-01-07 LAB — HEMOGLOBIN AND HEMATOCRIT, BLOOD
HCT: 36 % — ABNORMAL LOW (ref 39.0–52.0)
HEMOGLOBIN: 12.4 g/dL — AB (ref 13.0–17.0)

## 2018-01-07 NOTE — Progress Notes (Signed)
1 Day Post-Op Subjective: Patient reports mild right flank/abdominal pain. Good urine output. 150cc from [place]. Positive flatus  Objective: Vital signs in last 24 hours: Temp:  [97.6 F (36.4 C)-98.7 F (37.1 C)] 97.7 F (36.5 C) (08/03 1007) Pulse Rate:  [56-83] 56 (08/03 1007) Resp:  [8-20] 16 (08/03 1007) BP: (103-129)/(64-79) 117/79 (08/03 1007) SpO2:  [96 %-100 %] 97 % (08/03 1007)  Intake/Output from previous day: 08/02 0701 - 08/03 0700 In: 4540.3 [P.O.:860; I.V.:3400.3; IV Piggyback:200] Out: 2128 [Urine:1725; Drains:153; Blood:250] Intake/Output this shift: Total I/O In: 1113.8 [P.O.:240; I.V.:873.8] Out: -   Physical Exam:  General:alert, cooperative and appears stated age GI: soft, distended and tenderness: RUQ Male genitalia: not done Extremities: extremities normal, atraumatic, no cyanosis or edema  Lab Results: Recent Labs    01/06/18 1128 01/07/18 0507  HGB 14.0 12.4*  HCT 42.4 36.0*   BMET Recent Labs    01/07/18 0507  NA 137  K 4.2  CL 104  CO2 26  GLUCOSE 138*  BUN 14  CREATININE 0.95  CALCIUM 8.6*   No results for input(s): LABPT, INR in the last 72 hours. No results for input(s): LABURIN in the last 72 hours. No results found for this or any previous visit.  Studies/Results: No results found.  Assessment/Plan: POD #1 rigth partial nephrectomy  1. D/c foley 2. Advanced diet to regular 3. JP for creatinine   LOS: 1 day   Nicolette Bang 01/07/2018, 11:32 AM

## 2018-01-08 DIAGNOSIS — C641 Malignant neoplasm of right kidney, except renal pelvis: Secondary | ICD-10-CM | POA: Diagnosis not present

## 2018-01-08 NOTE — Discharge Instructions (Signed)
Laparoscopic Nephrectomy, Care After Refer to this sheet in the next few weeks. These instructions provide you with information on caring for yourself after your procedure. Your health care provider may also give you more specific instructions. Your treatment has been planned according to current medical practices, but problems sometimes occur. Call your health care provider if you have any problems or questions after your procedure. What can I expect after the procedure? After the procedure, it is common to have:  Pain.  Soreness and numbness in your incision areas.  Follow these instructions at home: Activity  Return to your normal activities as told by your health care provider. Ask what activities are safe for you.  Do not lift anything that is heavier than 10 lb (4.5 kg) until your health care provider approves. Bathing  Do not take baths, swim, or use a hot tub until your health care provider approves. Ask your health care provider if you can take showers. You may only be allowed to take sponge baths for bathing.  Keep the bandage (dressing) dry until your health care provider says it can be removed. Incision care  Follow instructions from your health care provider about how to take care of your incisions. Make sure you: ? Wash your hands with soap and water before you change your dressing. If soap and water are not available, use hand sanitizer. ? Change your dressing as told by your health care provider. ? Leave stitches (sutures), skin glue, or adhesive strips in place. These skin closures may need to be in place for 2 weeks or longer. If adhesive strip edges start to loosen and curl up, you may trim the loose edges. Do not remove adhesive strips completely unless your health care provider tells you to do that.  Check your incision area every day for signs of infection. Watch for: ? Redness, swelling, or pain. ? Fluid, blood, or pus. General instructions  Take medicines only  as directed by your health care provider.  Do not drive or operate heavy machinery while taking prescription pain medicine.  Keep all follow-up visits as told by your health care provider. This is important.  Drink enough fluid to keep your urine clear or pale yellow.  If you have a drain, follow instructions from your health care provider about how to care for it. Contact a health care provider if:  Your pain is worse.  You have redness, swelling, or pain at your incision site.  You have a bad smell coming from the wound or dressing. Get help right away if:  You have a rash.  You have trouble breathing or feel short of breath.  You feel light-headed or dizzy.  You have blood in your urine.  You have fluid, blood, or pus coming from your incision.  You have a fever. This information is not intended to replace advice given to you by your health care provider. Make sure you discuss any questions you have with your health care provider. Document Released: 02/12/2015 Document Revised: 07/07/2016 Document Reviewed: 10/16/2014 Elsevier Interactive Patient Education  [employer].

## 2018-01-08 NOTE — Progress Notes (Signed)
Patient and family given discharge, medication, and follow up instructions, verbalized understanding, IV and JP drain removed, tolerated well, personal belongings and prescription with patient, family to transport home

## 2018-01-09 ENCOUNTER — Encounter (HOSPITAL_COMMUNITY): Payer: Self-pay | Admitting: Urology

## 2018-01-09 NOTE — Anesthesia Postprocedure Evaluation (Signed)
Anesthesia Post Note  Patient: Cesar Dawson  Procedure(s) Performed: XI ROBOTIC ASSITED PARTIAL NEPHRECTOMY (Right )     Patient location during evaluation: PACU Anesthesia Type: General Level of consciousness: awake and alert Pain management: pain level controlled Vital Signs Assessment: post-procedure vital signs reviewed and stable Respiratory status: spontaneous breathing, nonlabored ventilation, respiratory function stable and patient connected to nasal cannula oxygen Cardiovascular status: blood pressure returned to baseline and stable Postop Assessment: no apparent nausea or vomiting Anesthetic complications: no    Last Vitals:  Vitals:   01/07/18 2152 01/08/18 0400  BP: 135/77 118/83  Pulse: 84 80  Resp: 18 18  Temp: 37.4 C 36.8 C  SpO2: 94% 95%    Last Pain:  Vitals:   01/08/18 0709  TempSrc:   PainSc: 0-No pain                 Effie Berkshire

## 2018-07-04 ENCOUNTER — Other Ambulatory Visit (HOSPITAL_COMMUNITY): Payer: Self-pay | Admitting: Urology

## 2018-07-04 ENCOUNTER — Ambulatory Visit (HOSPITAL_COMMUNITY)
Admission: RE | Admit: 2018-07-04 | Discharge: 2018-07-04 | Disposition: A | Discharge status: Home / Self Care | Payer: Medicare Other | Source: Ambulatory Visit | Attending: Urology | Admitting: Urology

## 2018-07-04 DIAGNOSIS — C642 Malignant neoplasm of left kidney, except renal pelvis: Secondary | ICD-10-CM

## 2019-08-01 ENCOUNTER — Ambulatory Visit (HOSPITAL_COMMUNITY)
Admission: RE | Admit: 2019-08-01 | Discharge: 2019-08-01 | Disposition: A | Discharge status: Home / Self Care | Payer: Medicare Other | Source: Ambulatory Visit | Attending: Urology | Admitting: Urology

## 2019-08-01 ENCOUNTER — Other Ambulatory Visit (HOSPITAL_COMMUNITY): Payer: Self-pay | Admitting: Urology

## 2019-08-01 ENCOUNTER — Other Ambulatory Visit: Payer: Self-pay

## 2019-08-01 DIAGNOSIS — C642 Malignant neoplasm of left kidney, except renal pelvis: Secondary | ICD-10-CM

## 2020-07-29 ENCOUNTER — Other Ambulatory Visit (HOSPITAL_COMMUNITY): Payer: Self-pay | Admitting: Urology

## 2020-07-29 ENCOUNTER — Other Ambulatory Visit: Payer: Self-pay

## 2020-07-29 ENCOUNTER — Ambulatory Visit (HOSPITAL_COMMUNITY)
Admission: RE | Admit: 2020-07-29 | Discharge: 2020-07-29 | Disposition: A | Discharge status: Home / Self Care | Payer: Medicare Other | Source: Ambulatory Visit | Attending: Urology | Admitting: Urology

## 2020-07-29 DIAGNOSIS — C642 Malignant neoplasm of left kidney, except renal pelvis: Secondary | ICD-10-CM | POA: Diagnosis present

## 2021-10-04 IMAGING — DX DG CHEST 2V
2 series · 2 of 2 positions shown · non-contrast
Comparison: Chest radiograph 07/04/2018

CLINICAL DATA: Renal cell carcinoma, left.

EXAM:
CHEST - 2 VIEW

[chest pa]
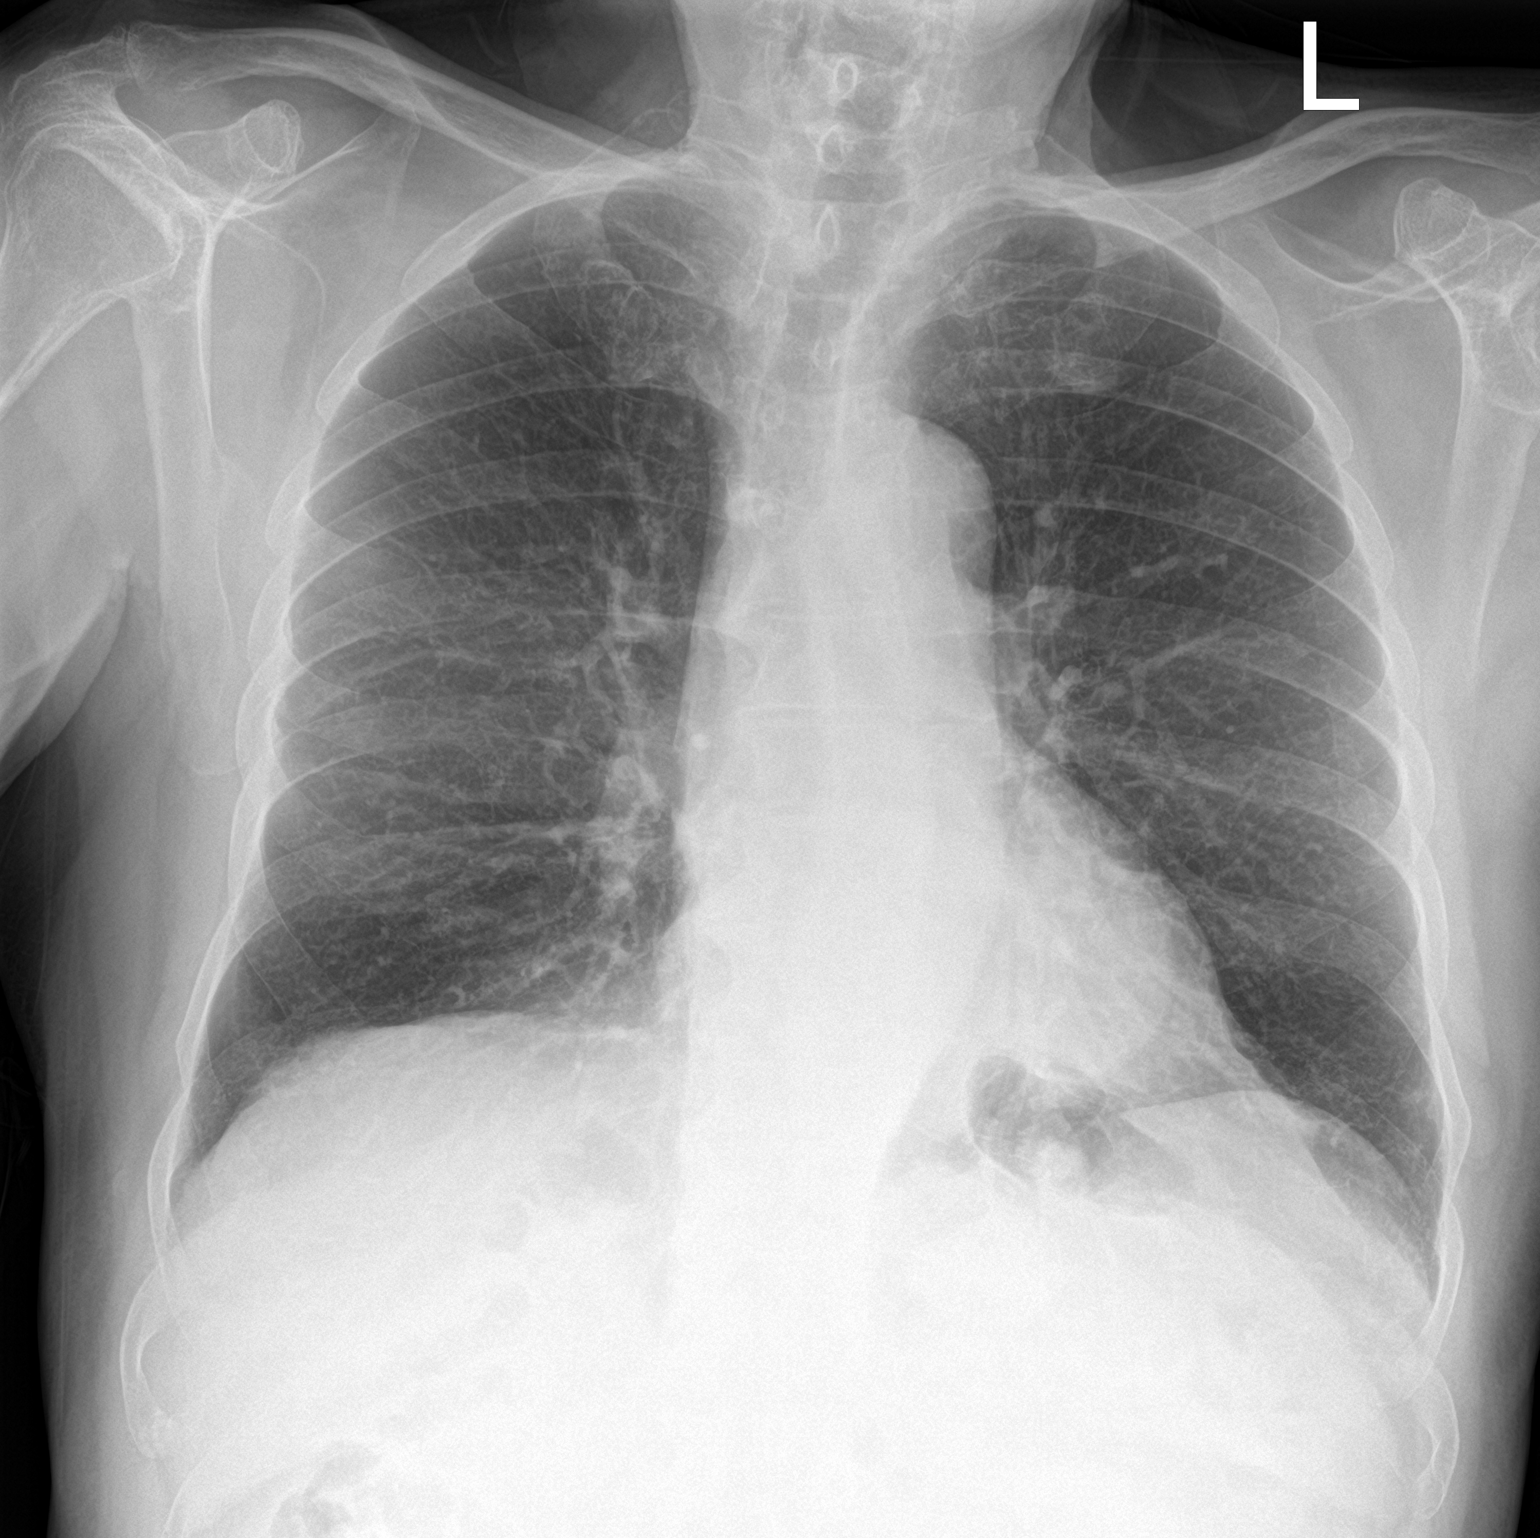

[chest lat]
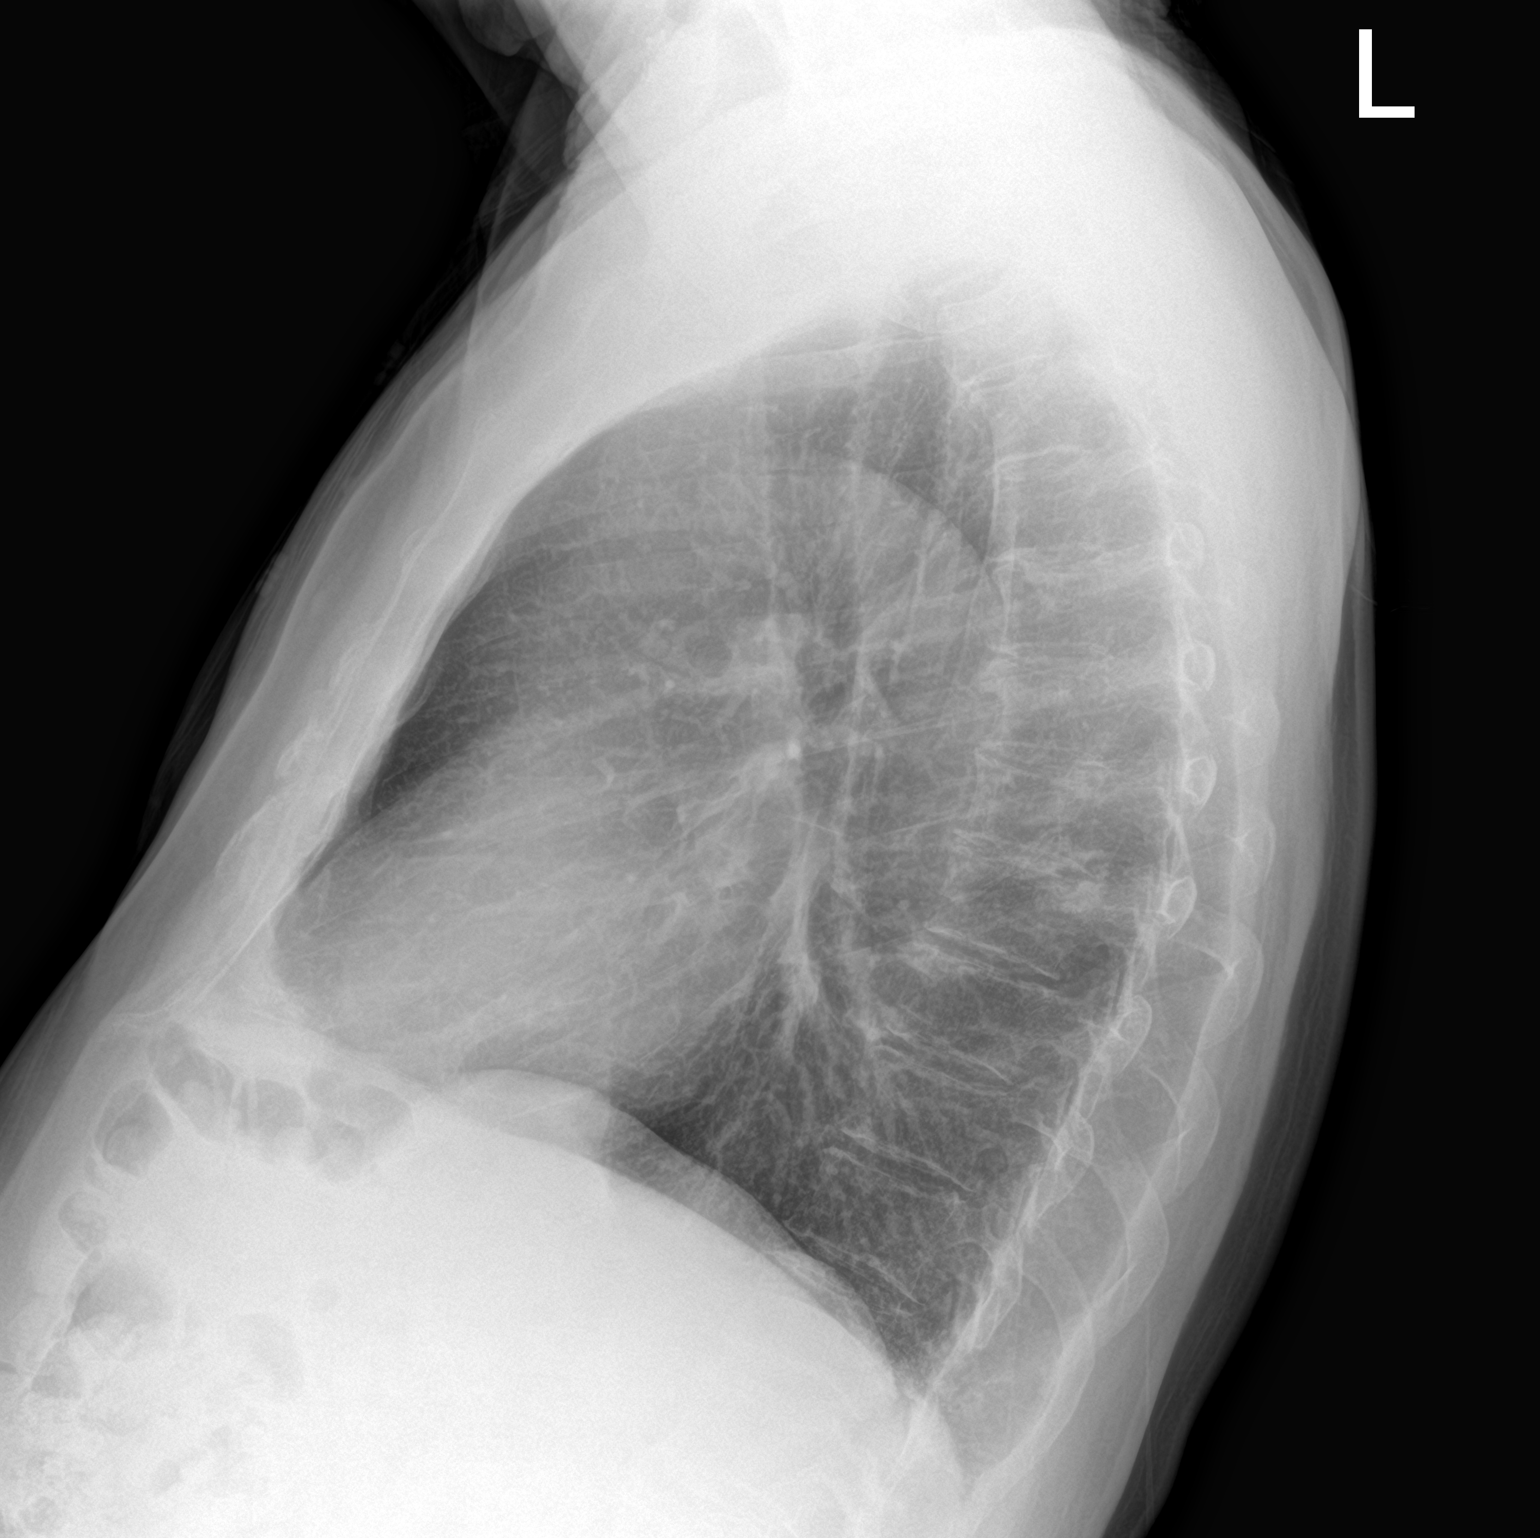

[2 of 2 positions shown; findings below may reference images not displayed]

FINDINGS: Heart size within normal limits. No evidence of airspace
consolidation within the lungs. No evidence of pleural effusion or
pneumothorax. No acute bony abnormality. Thoracic spondylosis.
IMPRESSION: No evidence of acute cardiopulmonary abnormality.

## 2022-10-02 IMAGING — DX DG CHEST 2V
2 series · 2 of 2 positions shown · non-contrast
Comparison: August 01, 2019.

CLINICAL DATA: History of left renal neoplasm.

EXAM:
CHEST - 2 VIEW

[chest pa]
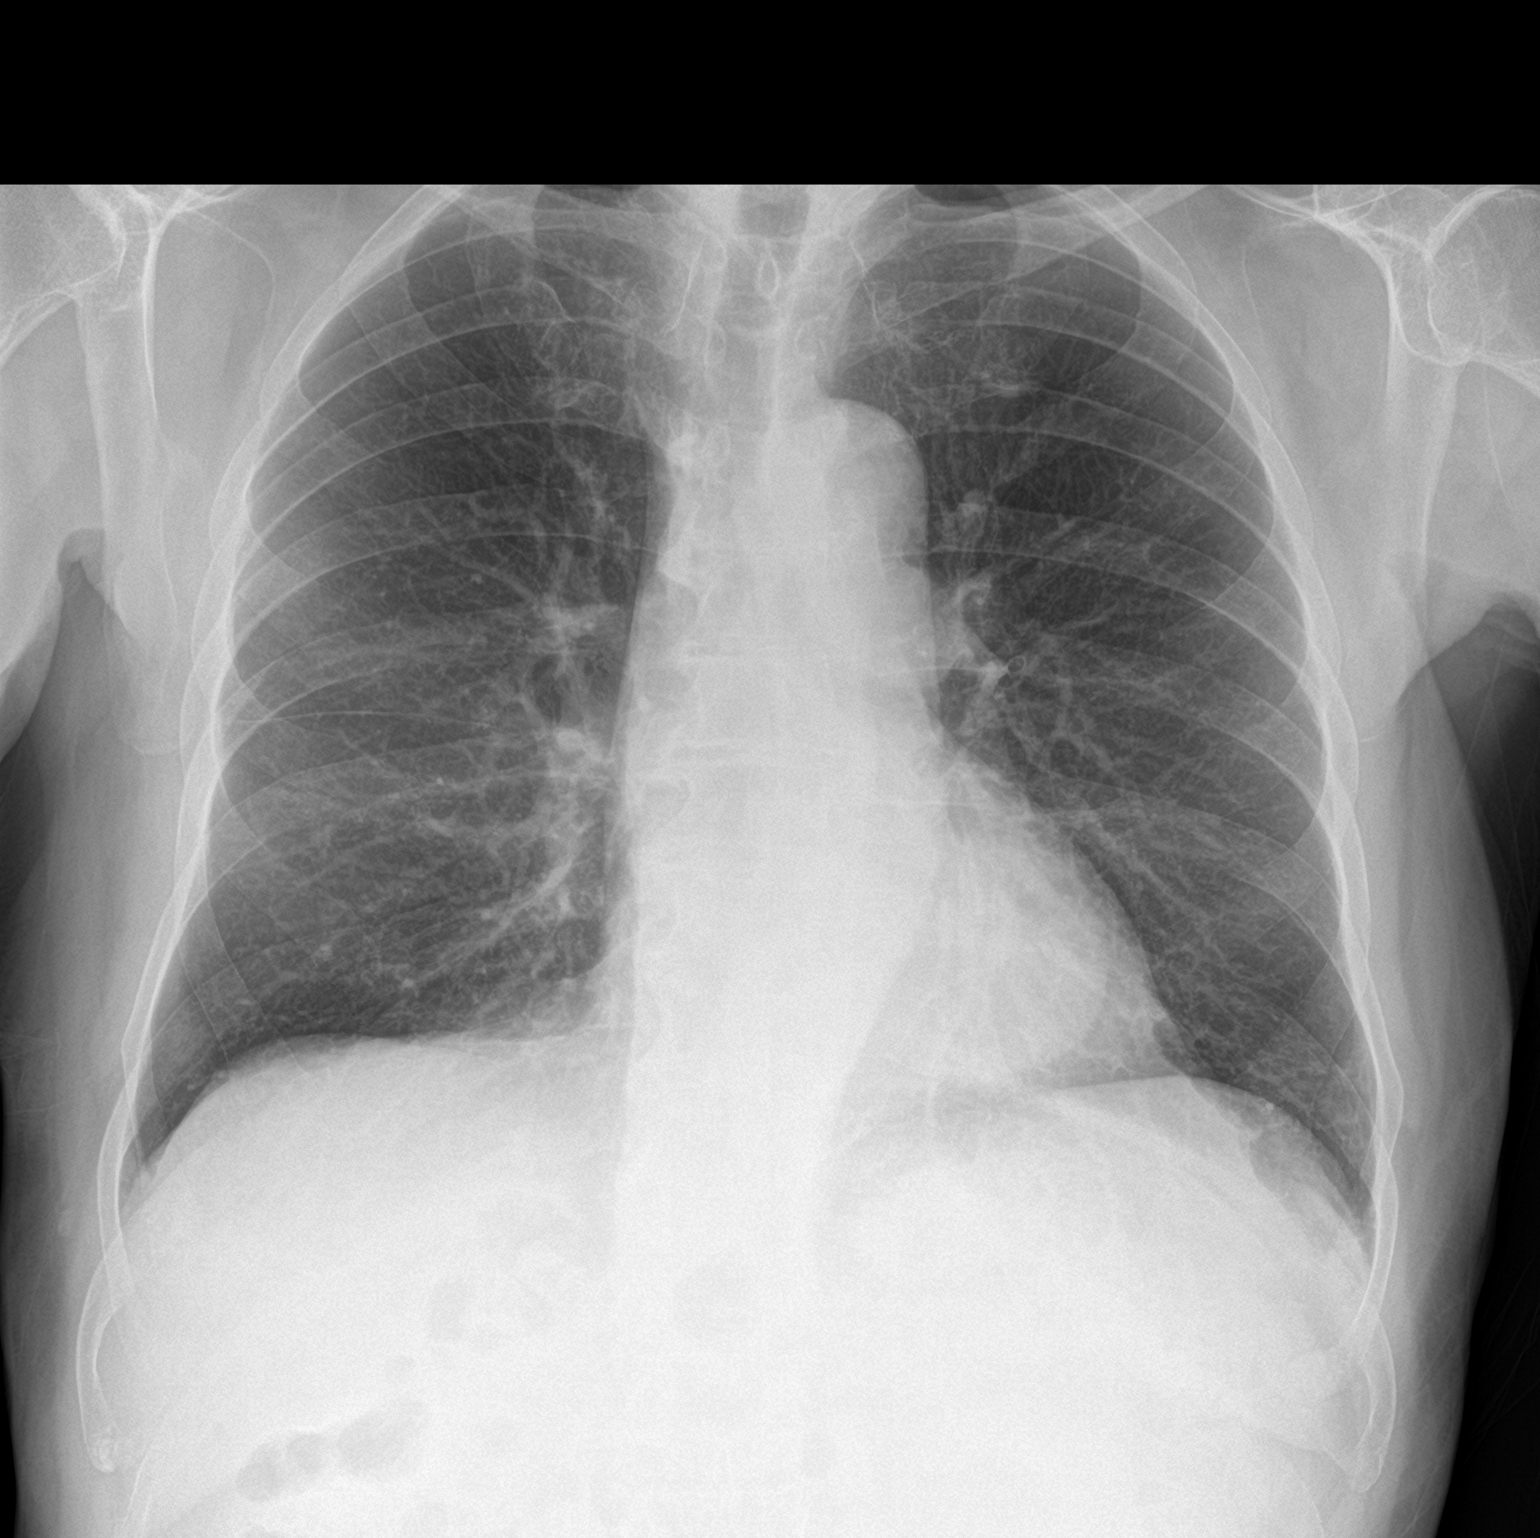

[chest lat]
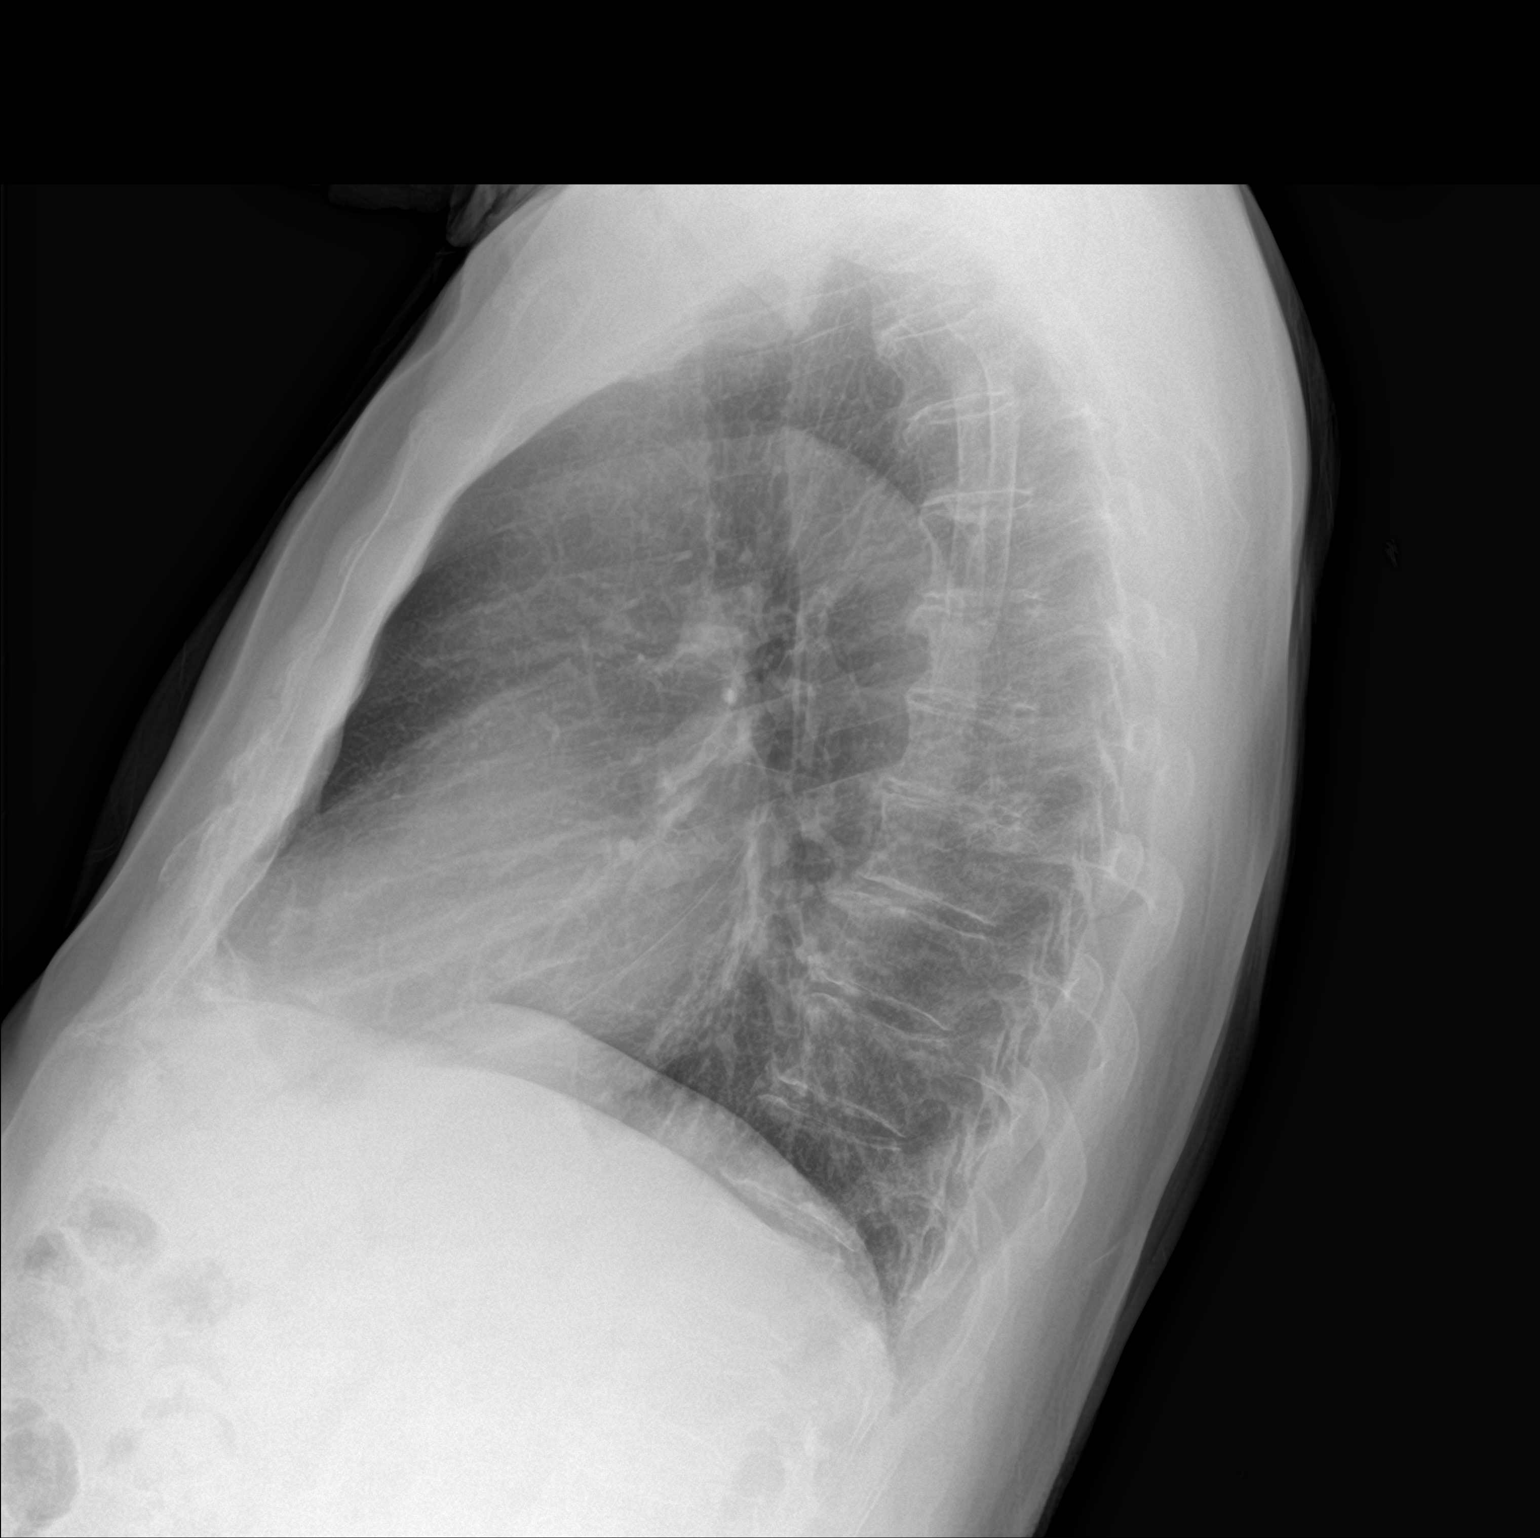

[2 of 2 positions shown; findings below may reference images not displayed]

FINDINGS: The heart size and mediastinal contours are within normal limits.
Both lungs are clear. The visualized skeletal structures are
unremarkable.
IMPRESSION: No active cardiopulmonary disease.
